# Patient Record
Sex: Female | Born: 2000 | Hispanic: No | Marital: Single | Smoking: Never smoker
Health system: Southern US, Community
[De-identification: ages and names within clinical notes are randomized; demographics above are authoritative.]

## PROBLEM LIST (undated history)

## (undated) DIAGNOSIS — R51 Headache: Secondary | ICD-10-CM

## (undated) DIAGNOSIS — H539 Unspecified visual disturbance: Secondary | ICD-10-CM

## (undated) DIAGNOSIS — R519 Headache, unspecified: Secondary | ICD-10-CM

## (undated) HISTORY — PX: APPENDECTOMY: SHX54

---
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
## 2021-04-13 IMAGING — CT CT RENAL STONE PROTOCOL
2 of 4 series · 15 of 46 positions shown, 17 images · non-contrast
Comparison: None available.

CLINICAL DATA: Initial evaluation for acute right flank pain for 2
days.

EXAM:
CT ABDOMEN AND PELVIS WITHOUT CONTRAST
TECHNIQUE: Multidetector CT imaging of the abdomen and pelvis was performed
following the standard protocol without IV contrast.

[Series 2: axial st · axial · 0.57mm/px · z∈[-565,-185]mm · 12 of 87 slices shown, 14 images]
[im 7/87  soft-tissue]
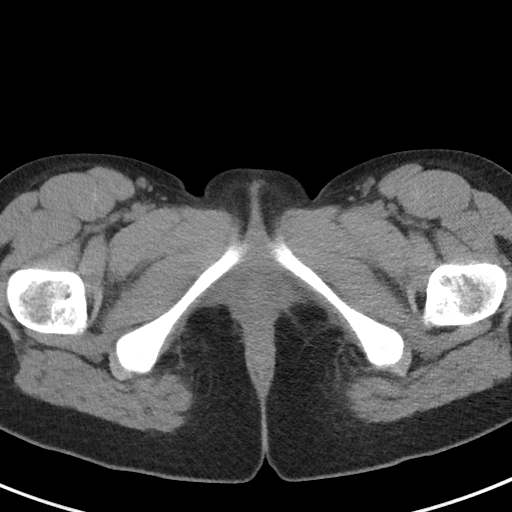
[im 7/87  bone]
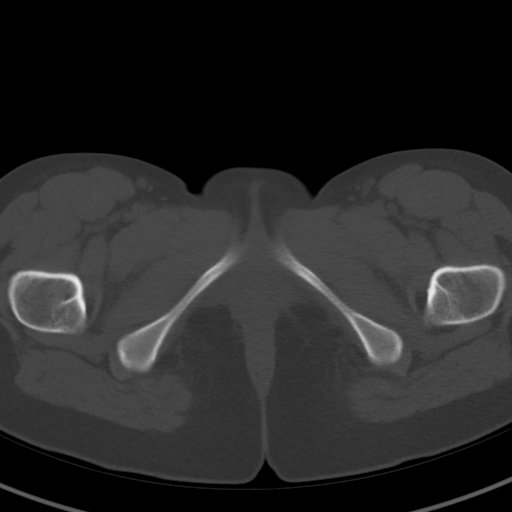
[im 14/87  soft-tissue]
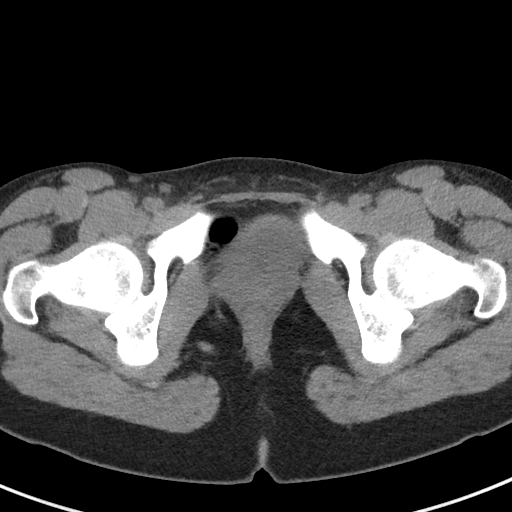
[im 21/87  soft-tissue]
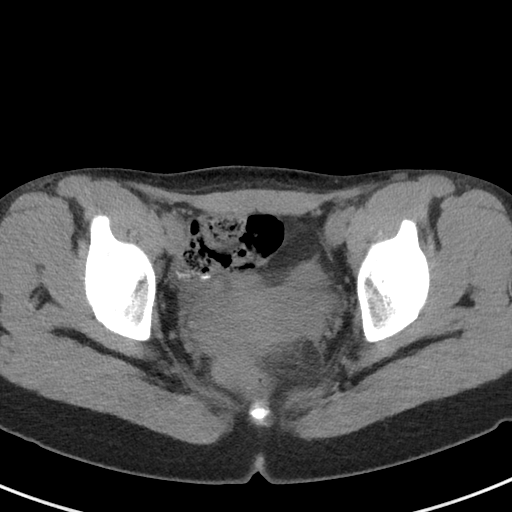
[im 28/87  soft-tissue]
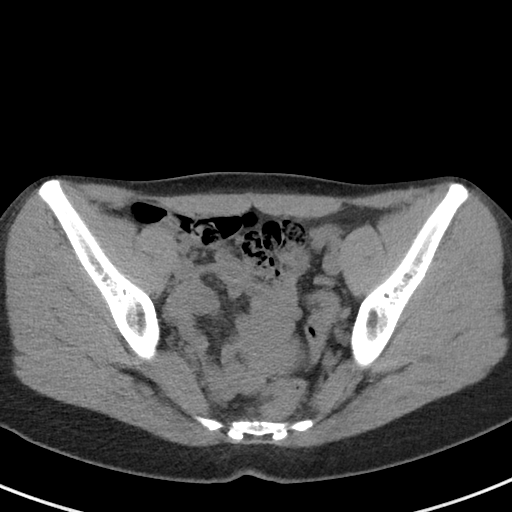
[im 35/87  soft-tissue]
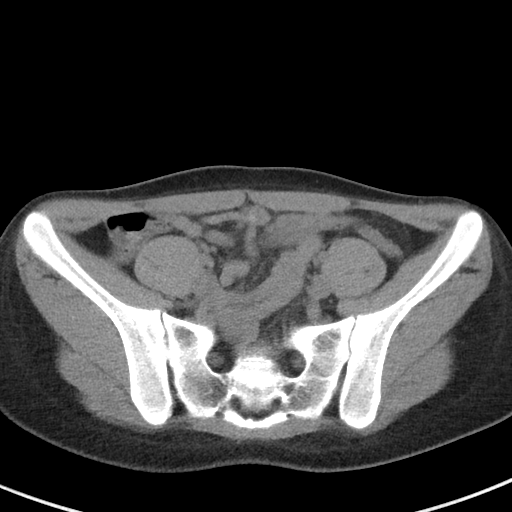
[im 42/87  soft-tissue]
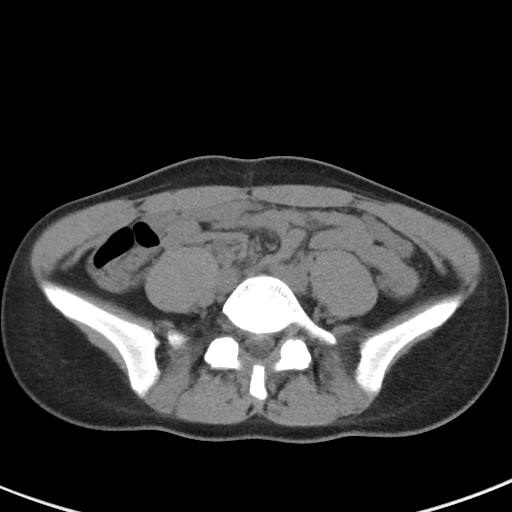
[im 49/87  soft-tissue]
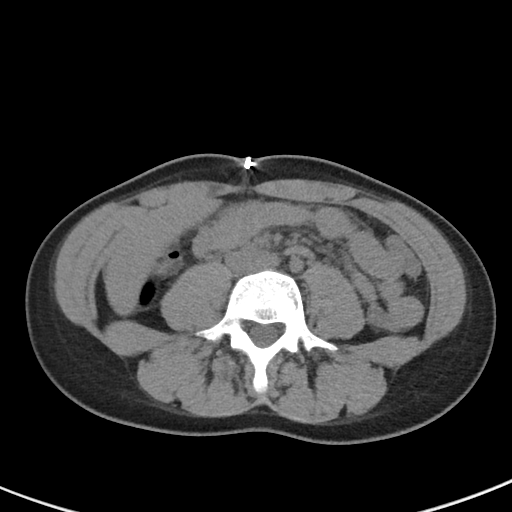
[im 56/87  soft-tissue]
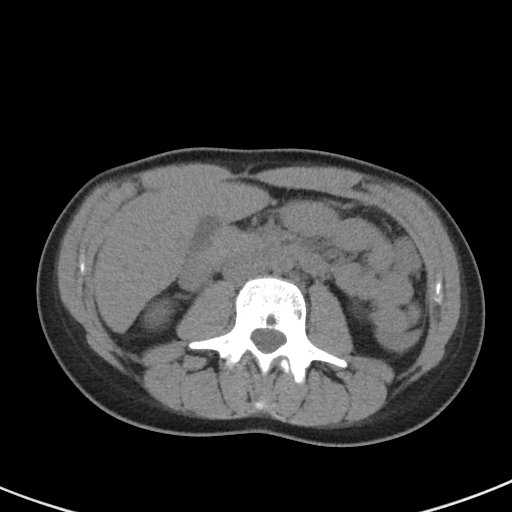
[im 62/87  soft-tissue]
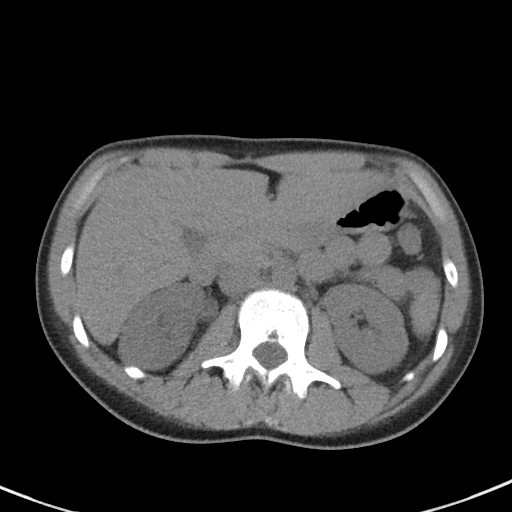
[im 62/87  bone]
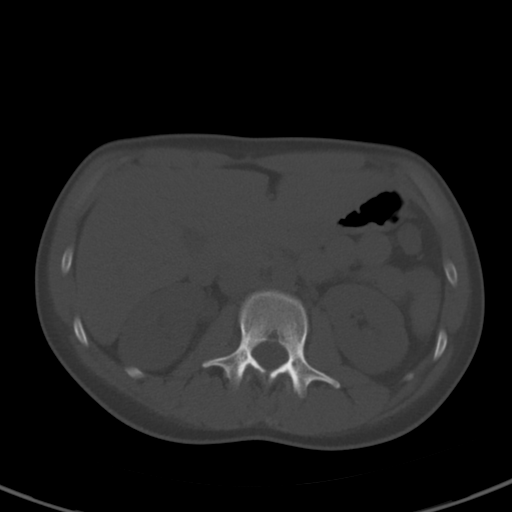
[im 69/87  soft-tissue]
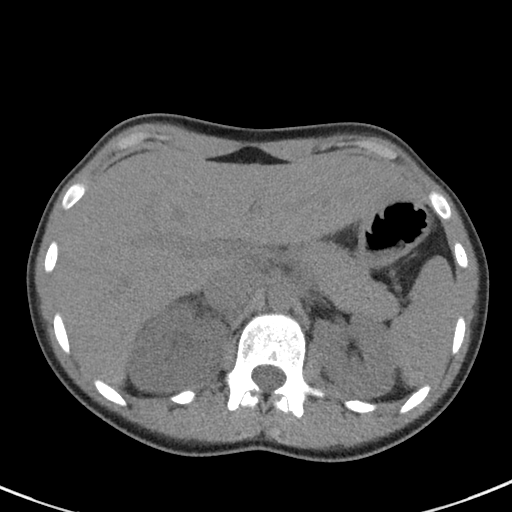
[im 76/87  soft-tissue]
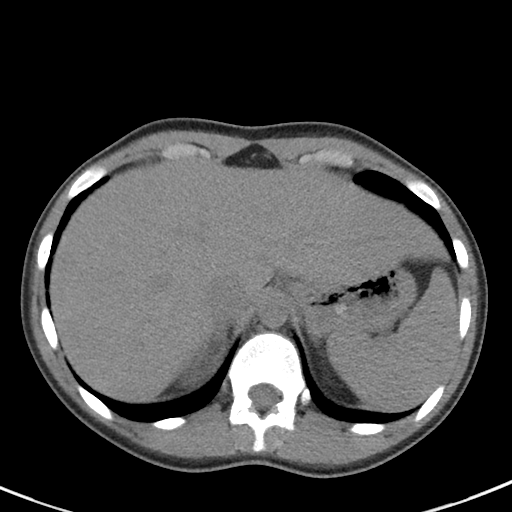
[im 83/87  soft-tissue]
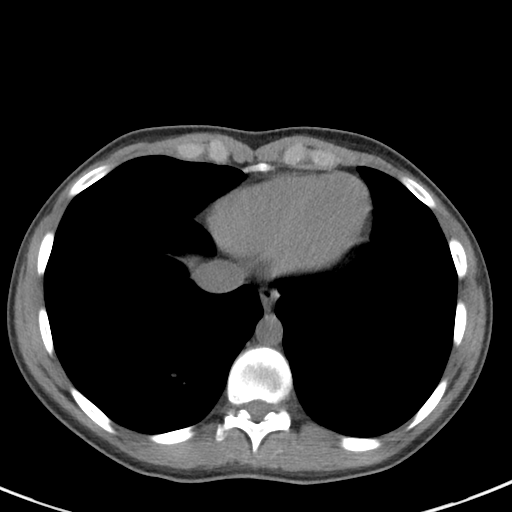

[Series 4: coronal st · coronal · 0.62mm/px · 3 of 80 slices shown]
[im 27/80  soft-tissue]
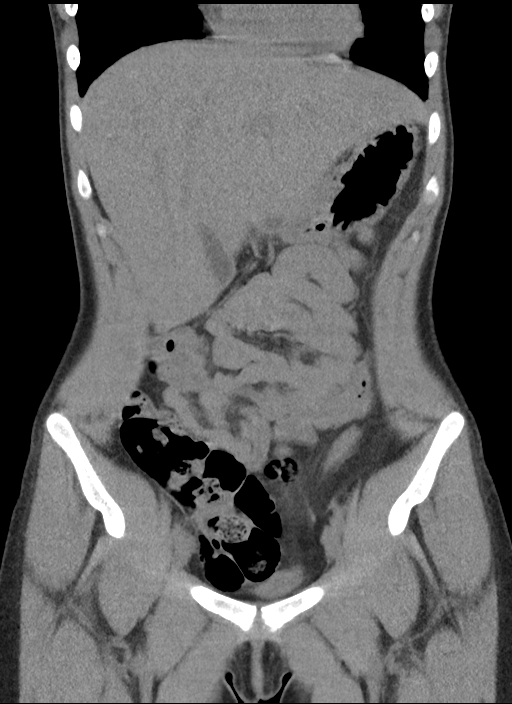
[im 36/80  soft-tissue]
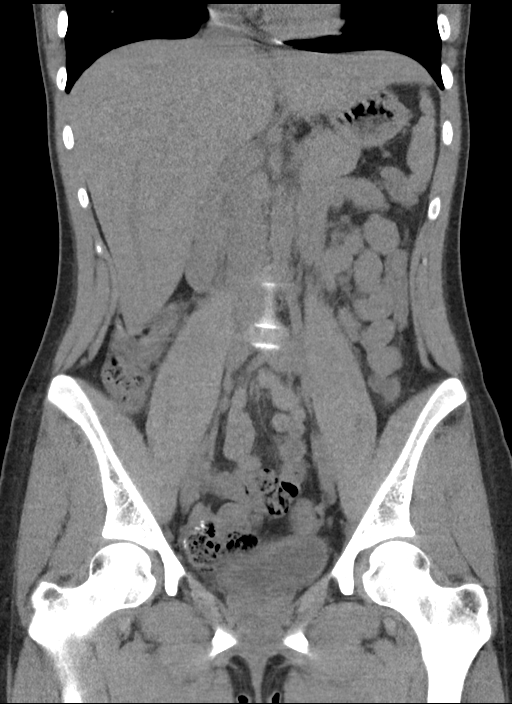
[im 44/80  soft-tissue]
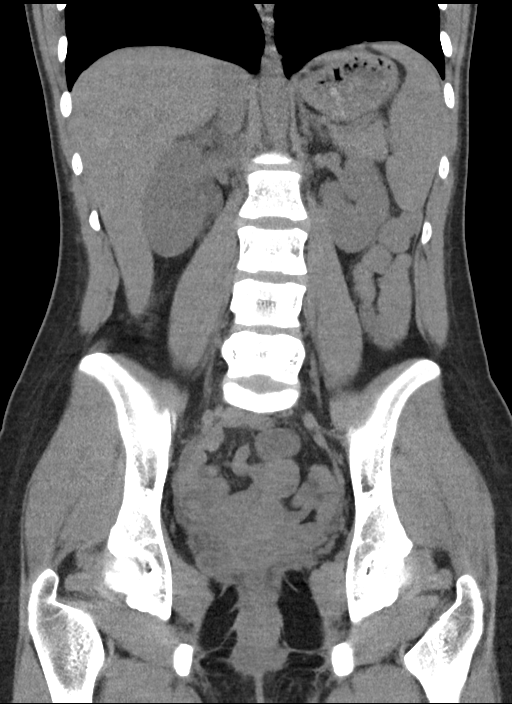

[15 of 46 positions shown; findings below may reference images not displayed]

FINDINGS: Lower chest: Visualized lung bases are clear.

Hepatobiliary: Liver demonstrates a normal unenhanced appearance.
Gallbladder within normal limits. No biliary dilatation.

Pancreas: Pancreas within normal limits.

Spleen: Spleen within normal limits.

Adrenals/Urinary Tract: Adrenal glands are normal.

On the right, there is a punctate 2 mm obstructive stone at the
right UVJ with secondary mild right hydroureteronephrosis (series 4,
image 42). Few additional punctate nonobstructive calculi present
within the right kidney. No other visible radiopaque stones seen
along the course of the right ureter.

On the left, few additional punctate nonobstructive calculi seen. No
obstructive radiopaque calculi seen along the course of the left
renal collecting system. No left-sided hydronephrosis or
hydroureter. Partially distended bladder otherwise unremarkable.

Stomach/Bowel: Stomach within normal limits. No evidence for bowel
obstruction. Appendix is surgically absent. No acute inflammatory
changes seen about the bowels.

Vascular/Lymphatic: Intra-abdominal aorta of normal caliber. No
visible adenopathy, although evaluation mildly limited by lack of IV
contrast and paucity of intra-abdominal fat.

Reproductive: Uterus and ovaries within normal limits for age.

Other: Trace free fluid within the pelvic cul-de-sac, likely
physiologic. No free air.

Musculoskeletal: No acute osseous abnormality. No discrete or
worrisome osseous lesions. Mild lumbar levoscoliosis noted.
IMPRESSION: 1. 2 mm obstructive stone at the right UVJ with secondary mild right
hydroureteronephrosis.
2. Additional punctate bilateral nonobstructive nephrolithiasis.
3. No other acute intra-abdominal or pelvic process.
